# Patient Record
Sex: Male | Born: 2000 | Race: Black or African American | Hispanic: No | Marital: Single | Smoking: Never smoker
Health system: Southern US, Community
[De-identification: ages and names within clinical notes are randomized; demographics above are authoritative.]

---
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
## 2022-11-06 ENCOUNTER — Encounter: Payer: Self-pay | Admitting: Medical

## 2022-11-06 ENCOUNTER — Ambulatory Visit (INDEPENDENT_AMBULATORY_CARE_PROVIDER_SITE_OTHER): Payer: No Typology Code available for payment source | Admitting: Medical

## 2022-11-06 VITALS — BP 110/80 | HR 53 | Ht 73.0 in | Wt 296.8 lb

## 2022-11-06 DIAGNOSIS — R0683 Snoring: Secondary | ICD-10-CM | POA: Diagnosis not present

## 2022-11-06 DIAGNOSIS — Z6839 Body mass index (BMI) 39.0-39.9, adult: Secondary | ICD-10-CM

## 2022-11-06 DIAGNOSIS — G43009 Migraine without aura, not intractable, without status migrainosus: Secondary | ICD-10-CM | POA: Diagnosis not present

## 2022-11-06 MED ORDER — UBRELVY 50 MG PO TABS: 1.0000 | ORAL_TABLET | Freq: Every day | ORAL | 0 refills | Status: AC

## 2022-11-06 NOTE — Progress Notes (Signed)
Subjective:  Keith Porter. is a 22 y.o. male who presents for Chief Complaint  Patient presents with   get established    Get established. Getting migraines headaches that will come every 2 days and the last one lasted 2 days. Takes ibuprofen or excedrin      Here as a new patient.  No recent primary care  Concerned about history of headache, migraines.  Has dealt on and off with headaches since teenage years.  Gets frequent headache, but now that he has insurance, wants eval.  Headaches often are left sided but sometimes generalized.   Headaches throbbing.   No nausea. Bright light and loud noise make them worse.  No dizziness, no vision changes with the headaches.   In a month can get 1-2 bad headaches.   Last month headache lasted 2 days.  Headaches sometimes last only 6 hours, but sometimes more than a day.    Uses Excedrin or ibuprofen typically.   No prior eval and treatment for this.   In high school just dealt with this.  Works at [employer], HR, full time.  Not currently in school.    Exercise some with basketball 2-3 days per week.  Lives with father.  He snores loud.  There has been mention of disrupted sleep or apnea.  No prior sleep study.    No other aggravating or relieving factors.    No other c/o.   No past medical history on file.  No current outpatient medications on file prior to visit.   No current facility-administered medications on file prior to visit.    The following portions of the patient's history were reviewed and updated as appropriate: allergies, current medications, past family history, past medical history, past social history, past surgical history and problem list.  ROS Otherwise as in subjective above    Objective: BP 110/80   Pulse (!) 53   Ht 6\' 1"  (1.854 m)   Wt 296 lb 12.8 oz (134.6 kg)   BMI 39.16 kg/m   General appearance: alert, no distress, well developed, well nourished, African American Oral cavity: MMM, no  lesions Neck: supple, no lymphadenopathy, no thyromegaly, no masses, no bruits Heart: RRR, normal S1, S2, no murmurs Lungs: CTA bilaterally, no wheezes, rhonchi, or rales Pulses: 2+ radial pulses, 2+ pedal pulses, normal cap refill Ext: no edema Neuro: cn 2-12 intact, nonfocal exam     Assessment: Encounter Diagnoses  Name Primary?   Migraine without aura and without status migrainosus, not intractable Yes   Snoring    BMI 39.0-39.9,adult      Plan: Discussed symptoms, concerns.  He seems to have migraines but possible secondary headaches.  Discussed possibly doing sleep study.  He will let me know if agreeable.    Discussed primary vs secondary headaches, possible causes.  Consider sleep study  Can continue Excedrin, ibuprofen or ubrelvy for abortive treatment.   Discussed trial of Ubrelvy, risks/benefits.     Avoid headache triggers   Govan was seen today for get established.  Diagnoses and all orders for this visit:  Migraine without aura and without status migrainosus, not intractable  Snoring  BMI 39.0-39.9,adult  Other orders -     Ubrogepant (UBRELVY) 50 MG TABS; Take 1 tablet (50 mg total) by mouth daily.    Follow up: soon for well visit fasting
